# Patient Record
Sex: Male | Born: 1975 | Race: Black or African American | Hispanic: No | Marital: Single | Smoking: Current every day smoker
Health system: Southern US, Community
[De-identification: ages and names within clinical notes are randomized; demographics above are authoritative.]

---

## 2003-02-21 ENCOUNTER — Emergency Department (HOSPITAL_COMMUNITY): Admission: EM | Admit: 2003-02-21 | Discharge: 2003-02-21 | Payer: Self-pay | Admitting: Emergency Medicine

## 2006-02-15 ENCOUNTER — Emergency Department (HOSPITAL_COMMUNITY): Admission: EM | Admit: 2006-02-15 | Discharge: 2006-02-15 | Payer: Self-pay | Admitting: Emergency Medicine

## 2006-06-06 ENCOUNTER — Emergency Department (HOSPITAL_COMMUNITY): Admission: EM | Admit: 2006-06-06 | Discharge: 2006-06-07 | Payer: Self-pay | Admitting: *Deleted

## 2009-06-23 ENCOUNTER — Emergency Department (HOSPITAL_COMMUNITY): Admission: EM | Admit: 2009-06-23 | Discharge: 2009-06-23 | Payer: Self-pay | Admitting: Emergency Medicine

## 2010-03-01 ENCOUNTER — Inpatient Hospital Stay (HOSPITAL_COMMUNITY): Admission: EM | Admit: 2010-03-01 | Discharge: 2010-03-05 | Payer: Self-pay | Admitting: Emergency Medicine

## 2010-03-04 ENCOUNTER — Encounter (INDEPENDENT_AMBULATORY_CARE_PROVIDER_SITE_OTHER): Payer: Self-pay | Admitting: General Surgery

## 2010-03-04 ENCOUNTER — Ambulatory Visit: Payer: Self-pay | Admitting: Surgery

## 2010-10-18 LAB — CBC
HCT: 38.3 % — ABNORMAL LOW (ref 39.0–52.0)
HCT: 38.4 % — ABNORMAL LOW (ref 39.0–52.0)
Hemoglobin: 13 g/dL (ref 13.0–17.0)
Hemoglobin: 13.1 g/dL (ref 13.0–17.0)
MCH: 32.8 pg (ref 26.0–34.0)
MCHC: 33.8 g/dL (ref 30.0–36.0)
MCHC: 34.2 g/dL (ref 30.0–36.0)
RBC: 3.91 MIL/uL — ABNORMAL LOW (ref 4.22–5.81)
RBC: 3.99 MIL/uL — ABNORMAL LOW (ref 4.22–5.81)
RDW: 13 % (ref 11.5–15.5)
WBC: 9.1 10*3/uL (ref 4.0–10.5)

## 2010-10-19 LAB — BASIC METABOLIC PANEL
CO2: 24 mEq/L (ref 19–32)
Calcium: 8.7 mg/dL (ref 8.4–10.5)
GFR calc Af Amer: >60 mL/min (ref >60)
GFR calc non Af Amer: >60 mL/min (ref >60)
Glucose, Bld: 95 mg/dL (ref 70–99)

## 2010-10-19 LAB — CBC
HCT: 40.9 % (ref 39.0–52.0)
HCT: 44.5 % (ref 39.0–52.0)
Hemoglobin: 15.2 g/dL (ref 13.0–17.0)
MCH: 33.8 pg (ref 26.0–34.0)
MCHC: 34.1 g/dL (ref 30.0–36.0)
MCHC: 34.2 g/dL (ref 30.0–36.0)
MCV: 98.8 fL (ref 78.0–100.0)
RBC: 4.5 MIL/uL (ref 4.22–5.81)
WBC: 12.2 10*3/uL — ABNORMAL HIGH (ref 4.0–10.5)
WBC: 14.1 10*3/uL — ABNORMAL HIGH (ref 4.0–10.5)
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
## 2015-05-24 ENCOUNTER — Emergency Department (HOSPITAL_COMMUNITY): Admission: EM | Admit: 2015-05-24 | Discharge: 2015-05-24 | Discharge status: Against Medical Advice | Payer: Self-pay

## 2015-05-24 NOTE — ED Notes (Signed)
No answer when pt called x 3; unable to locate pt

## 2015-10-15 ENCOUNTER — Encounter (HOSPITAL_COMMUNITY): Payer: Self-pay | Admitting: Emergency Medicine

## 2015-10-15 ENCOUNTER — Emergency Department (HOSPITAL_COMMUNITY): Payer: Self-pay

## 2015-10-15 ENCOUNTER — Emergency Department (HOSPITAL_COMMUNITY)
Admission: EM | Admit: 2015-10-15 | Discharge: 2015-10-15 | Disposition: A | Discharge status: Home / Self Care | Payer: Self-pay | Attending: Physician Assistant | Admitting: Physician Assistant

## 2015-10-15 DIAGNOSIS — Y998 Other external cause status: Secondary | ICD-10-CM | POA: Insufficient documentation

## 2015-10-15 DIAGNOSIS — Y9289 Other specified places as the place of occurrence of the external cause: Secondary | ICD-10-CM | POA: Insufficient documentation

## 2015-10-15 DIAGNOSIS — S61111A Laceration without foreign body of right thumb with damage to nail, initial encounter: Secondary | ICD-10-CM | POA: Insufficient documentation

## 2015-10-15 DIAGNOSIS — Y9389 Activity, other specified: Secondary | ICD-10-CM | POA: Insufficient documentation

## 2015-10-15 DIAGNOSIS — W231XXA Caught, crushed, jammed, or pinched between stationary objects, initial encounter: Secondary | ICD-10-CM | POA: Insufficient documentation

## 2015-10-15 DIAGNOSIS — S61319A Laceration without foreign body of unspecified finger with damage to nail, initial encounter: Secondary | ICD-10-CM

## 2015-10-15 DIAGNOSIS — S6991XA Unspecified injury of right wrist, hand and finger(s), initial encounter: Secondary | ICD-10-CM

## 2015-10-15 DIAGNOSIS — Z880 Allergy status to penicillin: Secondary | ICD-10-CM | POA: Insufficient documentation

## 2015-10-15 MED ORDER — CLINDAMYCIN HCL 300 MG PO CAPS: 300.0000 mg | ORAL_CAPSULE | Freq: Three times a day (TID) | ORAL | Status: AC

## 2015-10-15 MED ORDER — LIDOCAINE-EPINEPHRINE (PF) 2 %-1:200000 IJ SOLN
20.0000 mL | Freq: Once | INTRAMUSCULAR | Status: AC
  Administered 2015-10-15: 20 mL
  Filled 2015-10-15: qty 20

## 2015-10-15 MED ORDER — OXYCODONE-ACETAMINOPHEN 5-325 MG PO TABS
1.0000 | ORAL_TABLET | Freq: Once | ORAL | Status: AC
  Administered 2015-10-15: 1 via ORAL
  Filled 2015-10-15: qty 1

## 2015-10-15 MED ORDER — HYDROCODONE-ACETAMINOPHEN 5-325 MG PO TABS: 1.0000 | ORAL_TABLET | ORAL | Status: AC | PRN

## 2015-10-15 MED ORDER — CLINDAMYCIN PHOSPHATE 600 MG/50ML IV SOLN
600.0000 mg | Freq: Once | INTRAVENOUS | Status: AC
Start: 2015-10-15 — End: 2015-10-15
  Administered 2015-10-15: 600 mg via INTRAVENOUS
  Filled 2015-10-15: qty 50

## 2015-10-15 NOTE — Consult Note (Signed)
ORTHOPAEDIC CONSULTATION HISTORY & PHYSICAL REQUESTING PHYSICIAN: Courteney Randall An, MD  Chief Complaint: Right thumb injury  HPI: Thomas Leblanc is a 40 y.o. 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  Part of the frame under tension, released and struck him in the thumb on the right side, injuring it.  His tetanus is reportedly up-to-date.  He has not received any antibiotics in the emergency department.  History reviewed. No pertinent past medical history. History reviewed. No pertinent past surgical history. Social History   Social History  . Marital Status: Single    Spouse Name: N/A  . Number of Children: N/A  . Years of Education: N/A   Social History Main Topics  . Smoking status: Never Smoker   . Smokeless tobacco: None  . Alcohol Use: Yes  . Drug Use: Yes    Special: Marijuana  . Sexual Activity: Not Asked   Other Topics Concern  . None   Social History Narrative  . None   No family history on file. Allergies  Allergen Reactions  . Penicillins    Prior to Admission medications   Medication Sig Start Date End Date Taking? Authorizing Provider  clindamycin (CLEOCIN) 300 MG capsule Take 1 capsule (300 mg total) by mouth 3 (three) times daily. 10/15/15   Mack Hook, MD  HYDROcodone-acetaminophen (NORCO) 5-325 MG tablet Take 1-2 tablets by mouth every 4 (four) hours as needed. 10/15/15   Mack Hook, MD   Dg Finger Thumb Right  10/15/2015  CLINICAL DATA:  Right thumb pain resulting from vehicle falling on thumb while working. EXAM: RIGHT THUMB 2+V COMPARISON:  06/06/2006 FINDINGS: There is a minimally displaced fracture along the radial base of the first distal phalanx. There is also a displaced slightly comminuted fracture over the mid to distal aspect of the first distal phalanx. IMPRESSION: Minimally displaced/ comminuted fracture of the mid to distal aspect of the first distal phalanx as well as minimally displaced fracture  along the radial base of the first distal phalanx. Electronically Signed   By: Elberta Fortis M.D.   On: 10/15/2015 13:54    Positive ROS: All other systems have been reviewed and were otherwise negative with the exception of those mentioned in the HPI and as above.  Physical Exam: Vitals: Refer to EMR. Constitutional:  WD, WN, NAD HEENT:  NCAT, EOMI Neuro/Psych:  Alert & oriented to person, place, and time; appropriate mood & affect Lymphatic: No generalized extremity edema or lymphadenopathy Extremities / MSK:  The extremities are normal with respect to appearance, ranges of motion, joint stability, muscle strength/tone, sensation, & perfusion except as otherwise noted:  The right thumb has a linear slightly oblique laceration on the dorsum extending from the midportion of the nail proximally just proximal to the dorsal IP flexion creases.  It appears to damage skin, nailbed, and there is an underlying fracture.  He can actively flex and extend the thumb at the IP joint.  The nail plate itself is present, in 2 separate pieces, and much of it is no longer adherent to the underlying nail bed.  Assessment: Right thumb open fracture distal phalanx with associated nailbed and soft tissue injury  Plan: I instilled a digital block with lidocaine bearing epinephrine.  A tourniquet was applied to the base of the digit, and it was copiously irrigated.  Then the thumb was prepped with Betadine and draped in usual sterile fashion.  The nail plate was removed from all of the nailbed to  which it was still adherent.  The injury was inspected.  There was indeed damage to the eponychial and hyponychial folds.  Using 5-0 Vicryl Rapide interrupted sutures, the skin was repaired for a total length of about 2 cm.  The distal portion of the fracture of the distal phalanx was then secured to the proximal portion with a percutaneously placed 21-gauge needle which was ultimately bent back over the nail bed and the hub  clipped off.  The nail bed was reapproximated and repaired with 6-0 chromic interrupted sutures, with multiple sutures placed due to the stellate configuration of the nailbed laceration.  With the soft tissues having been reapproximated and the fracture stabilized, the tourniquet was removed, the thumb again cleansed, and a dressing was applied with a dorsal tongue blade splint component.  Xeroform was applied over the nailbed and the wound.  He will receive a dose of IV and the mycin in the emergency department and be discharged on oral clindamycin, with outpatient follow-up arranged in the office.  He was also provided a prescription for pain medication.  No work with the right hand until at least his first follow-up visit.  Cliffton Astersavid A. Janee Mornhompson, MD      [facility]thopaedic & Hand [facility] [address] [place], [place]  [id number] Office: [phone] Mobile: [phone]  10/15/2015, 5:39 PM
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
## 2015-10-15 NOTE — ED Notes (Signed)
MD at bedside. 

## 2015-10-15 NOTE — Discharge Instructions (Signed)
Discharge Instructions   You have a dressing with a splint incorporated in it. Move your fingers as much as possible, making a full fist and fully opening the fist. Elevate your hand to reduce pain & swelling of the digits.  Ice over the operative site may be helpful to reduce pain & swelling.  DO NOT USE HEAT. Pain medicine has been prescribed for you.  Use your medicine as needed over the first 48 hours, and then you can begin to taper your use.  You may use Tylenol in place of your prescribed pain medication, but not IN ADDITION to it. Leave the dressing in place until you return to our office.  You may shower, but keep the bandage clean & dry.  You may drive a car when you are off of prescription pain medications and can safely control your vehicle with both hands. Our office will call you to arrange follow-up   Please call [phone] during normal business hours or [phone] after hours for any problems. Including the following:  - excessive redness of the incisions - drainage for more than 4 days - fever of more than 101.5 F  *Please note that pain medications will not be refilled after hours or on weekends.  WORK STATUS:   NO WORK WITH RIGHT HAND UNTIL AT LEAST FIRST FOLLOW-UP VISIT

## 2015-10-15 NOTE — ED Notes (Signed)
Patient reports dropping car on right thumb at work. 4" laceration to right thumb, bleeding not controlled, swelling and bruising noted to same.

## 2016-05-11 ENCOUNTER — Encounter (HOSPITAL_COMMUNITY): Payer: Self-pay | Admitting: Emergency Medicine

## 2016-05-11 ENCOUNTER — Emergency Department (HOSPITAL_COMMUNITY)
Admission: EM | Admit: 2016-05-11 | Discharge: 2016-05-11 | Disposition: A | Discharge status: Home / Self Care | Payer: Self-pay | Attending: Emergency Medicine | Admitting: Emergency Medicine

## 2016-05-11 DIAGNOSIS — Y939 Activity, unspecified: Secondary | ICD-10-CM | POA: Insufficient documentation

## 2016-05-11 DIAGNOSIS — S0501XA Injury of conjunctiva and corneal abrasion without foreign body, right eye, initial encounter: Secondary | ICD-10-CM | POA: Insufficient documentation

## 2016-05-11 DIAGNOSIS — X58XXXA Exposure to other specified factors, initial encounter: Secondary | ICD-10-CM | POA: Insufficient documentation

## 2016-05-11 DIAGNOSIS — F17210 Nicotine dependence, cigarettes, uncomplicated: Secondary | ICD-10-CM | POA: Insufficient documentation

## 2016-05-11 DIAGNOSIS — Y999 Unspecified external cause status: Secondary | ICD-10-CM | POA: Insufficient documentation

## 2016-05-11 DIAGNOSIS — Y929 Unspecified place or not applicable: Secondary | ICD-10-CM | POA: Insufficient documentation

## 2016-05-11 MED ORDER — CIPROFLOXACIN HCL 0.3 % OP OINT: TOPICAL_OINTMENT | OPHTHALMIC | 0 refills | Status: DC

## 2016-05-11 MED ORDER — FLUORESCEIN SODIUM 1 MG OP STRP
1.0000 | ORAL_STRIP | Freq: Once | OPHTHALMIC | Status: AC
  Administered 2016-05-11: 1 via OPHTHALMIC
  Filled 2016-05-11: qty 1

## 2016-05-11 MED ORDER — CIPROFLOXACIN HCL 0.3 % OP OINT: TOPICAL_OINTMENT | OPHTHALMIC | 0 refills | Status: AC

## 2016-05-11 MED ORDER — PROPARACAINE HCL 0.5 % OP SOLN
1.0000 [drp] | Freq: Once | OPHTHALMIC | Status: AC
  Administered 2016-05-11: 1 [drp] via OPHTHALMIC
  Filled 2016-05-11: qty 15

## 2016-05-11 NOTE — ED Triage Notes (Signed)
Pt c/o sensation of foreign object in eye. Pt works at [employer], felt something land in his eye while working last night. Vision blurry, eye pain, photophobia. Conjunctiva swollen, injected.

## 2016-05-11 NOTE — Discharge Instructions (Signed)
Go to Dr. Laruth BouchardGroat's office in the morning tomorrow.  It is extremely important you do so.

## 2016-05-11 NOTE — ED Provider Notes (Signed)
[facility]-EMERGENCY DEPT Provider Note   CSN: [id number] Arrival date & time: 05/11/16  [id number]  By signing my name below, I, Octavia Heirrianna Nassar, attest that this documentation has been prepared under the direction and in the presence of Bear StearnsKayla Azaan Leask, PA-C.  Electronically Signed: Octavia HeirArianna Nassar, ED Scribe. 05/11/16. 7:12 PM.    History   Chief Complaint No chief complaint on file.   The history is provided by the patient. No language interpreter was used.   HPI Comments: Jorje GuildWilliam Michaelsen is a 40 y.o. male who presents to the Emergency Department complaining of constant, gradual worsening, moderate right eye pain x 1 day. He reports associated photophobia, blurry vision and right eye redness. Pt says he was working on his car last night when something landed in his eye. He has tried flushing his eye and using visine to help with his pain with no relief. He denies any other complaints.   History reviewed. No pertinent past medical history.  There are no active problems to display for this patient.   History reviewed. No pertinent surgical history.     Home Medications    Prior to Admission medications   Medication Sig Start Date End Date Taking? Authorizing Provider  ciprofloxacin (CILOXAN) 0.3 % ophthalmic ointment Instill one drop every hour in affected eye 05/11/16   Cheri FowlerKayla Kiarrah Rausch, PA-C  clindamycin (CLEOCIN) 300 MG capsule Take 1 capsule (300 mg total) by mouth 3 (three) times daily. 10/15/15   Mack Hookavid Thompson, MD  HYDROcodone-acetaminophen (NORCO) 5-325 MG tablet Take 1-2 tablets by mouth every 4 (four) hours as needed. 10/15/15   Mack Hookavid Thompson, MD    Family History History reviewed. No pertinent family history.  Social History Social History  Substance Use Topics  . Smoking status: Current Every Day Smoker    Packs/day: 1.00    Types: Cigarettes  . Smokeless tobacco: Not on file  . Alcohol use Yes     Allergies   Penicillins   Review of Systems Review of Systems  Eyes:  Positive for photophobia, redness and visual disturbance.  All other systems reviewed and are negative.    Physical Exam Updated Vital Signs BP 117/77 (BP Location: Right Arm)   Pulse (!) 59   Temp 97.9 F (36.6 C) (Oral)   Resp 18   SpO2 100%   Physical Exam  Constitutional: He is oriented to person, place, and time. He appears well-developed and well-nourished.  HENT:  Head: Normocephalic and atraumatic.  Right Ear: External ear normal.  Left Ear: External ear normal.  Eyes: EOM and lids are normal. Pupils are equal, round, and reactive to light. Lids are everted and swept, no foreign bodies found. Right eye exhibits no discharge. No foreign body present in the right eye. Left eye exhibits no discharge. No foreign body present in the left eye. Right conjunctiva is injected. No scleral icterus.  Slit lamp exam:      The right eye shows corneal abrasion and fluorescein uptake. The right eye shows no corneal ulcer, no foreign body, no hyphema and no hypopyon.  Right eye with medial peripheral divot to cornea.  No evidence of globe rupture. Negative seidel sign.    Visual Acuity  Right Eye Distance: 20/50 Left Eye Distance: 20/20 Bilateral Distance: 20/20    Neck: No tracheal deviation present.  Pulmonary/Chest: Effort normal. No respiratory distress.  Abdominal: He exhibits no distension.  Musculoskeletal: Normal range of motion.  Neurological: He is alert and oriented to person, place, and time.  Skin: Skin  is warm and dry.  Psychiatric: He has a normal mood and affect. His behavior is normal.  Nursing note and vitals reviewed.    ED Treatments / Results  DIAGNOSTIC STUDIES: Oxygen Saturation is 100% on RA, normal by my interpretation.  COORDINATION OF CARE:  7:11 PM Discussed treatment plan with pt at bedside and pt agreed to plan.  Labs (all labs ordered are listed, but only abnormal results are displayed) Labs Reviewed - No data to display  EKG  EKG  Interpretation None       Radiology No results found.  Procedures Procedures (including critical care time)  Medications Ordered in ED Medications  proparacaine (ALCAINE) 0.5 % ophthalmic solution 1 drop (1 drop Right Eye Given 05/11/16 1920)  fluorescein ophthalmic strip 1 strip (1 strip Right Eye Given 05/11/16 1920)     Initial Impression / Assessment and Plan / ED Course  I have reviewed the triage vital signs and the nursing notes.  Pertinent labs & imaging results that were available during my care of the patient were reviewed by me and considered in my medical decision making (see chart for details).  Clinical Course   Patient presents with findings c/w corneal abrasion.  No foreign bodies.  Decreased visual acuity.  Spoke with Dr. Dione Booze, patient will follow up in clinic tomorrow morning.  Home with cipro drops.  Return precautions discussed.  Stable for discharge.   Case has been discussed with and seen by Dr. Rubin Payor who agrees with the above plan for discharge.  I personally performed the services described in this documentation, which was scribed in my presence. The recorded information has been reviewed and is accurate.  Final Clinical Impressions(s) / ED Diagnoses   Final diagnoses:  Abrasion of right cornea, initial encounter    New Prescriptions New Prescriptions   CIPROFLOXACIN (CILOXAN) 0.3 % OPHTHALMIC OINTMENT    Instill one drop every hour in affected eye         Cheri Fowler, PA-C 05/11/16 1953    Benjiman Core, MD 05/11/16 2313

## 2016-06-16 IMAGING — CR DG FINGER THUMB 2+V*R*
3 series · 3 of 3 positions shown · non-contrast
Comparison: 06/06/2006

CLINICAL DATA: Right thumb pain resulting from vehicle falling on
thumb while working.

EXAM:
RIGHT THUMB 2+V

[x finger pa right]
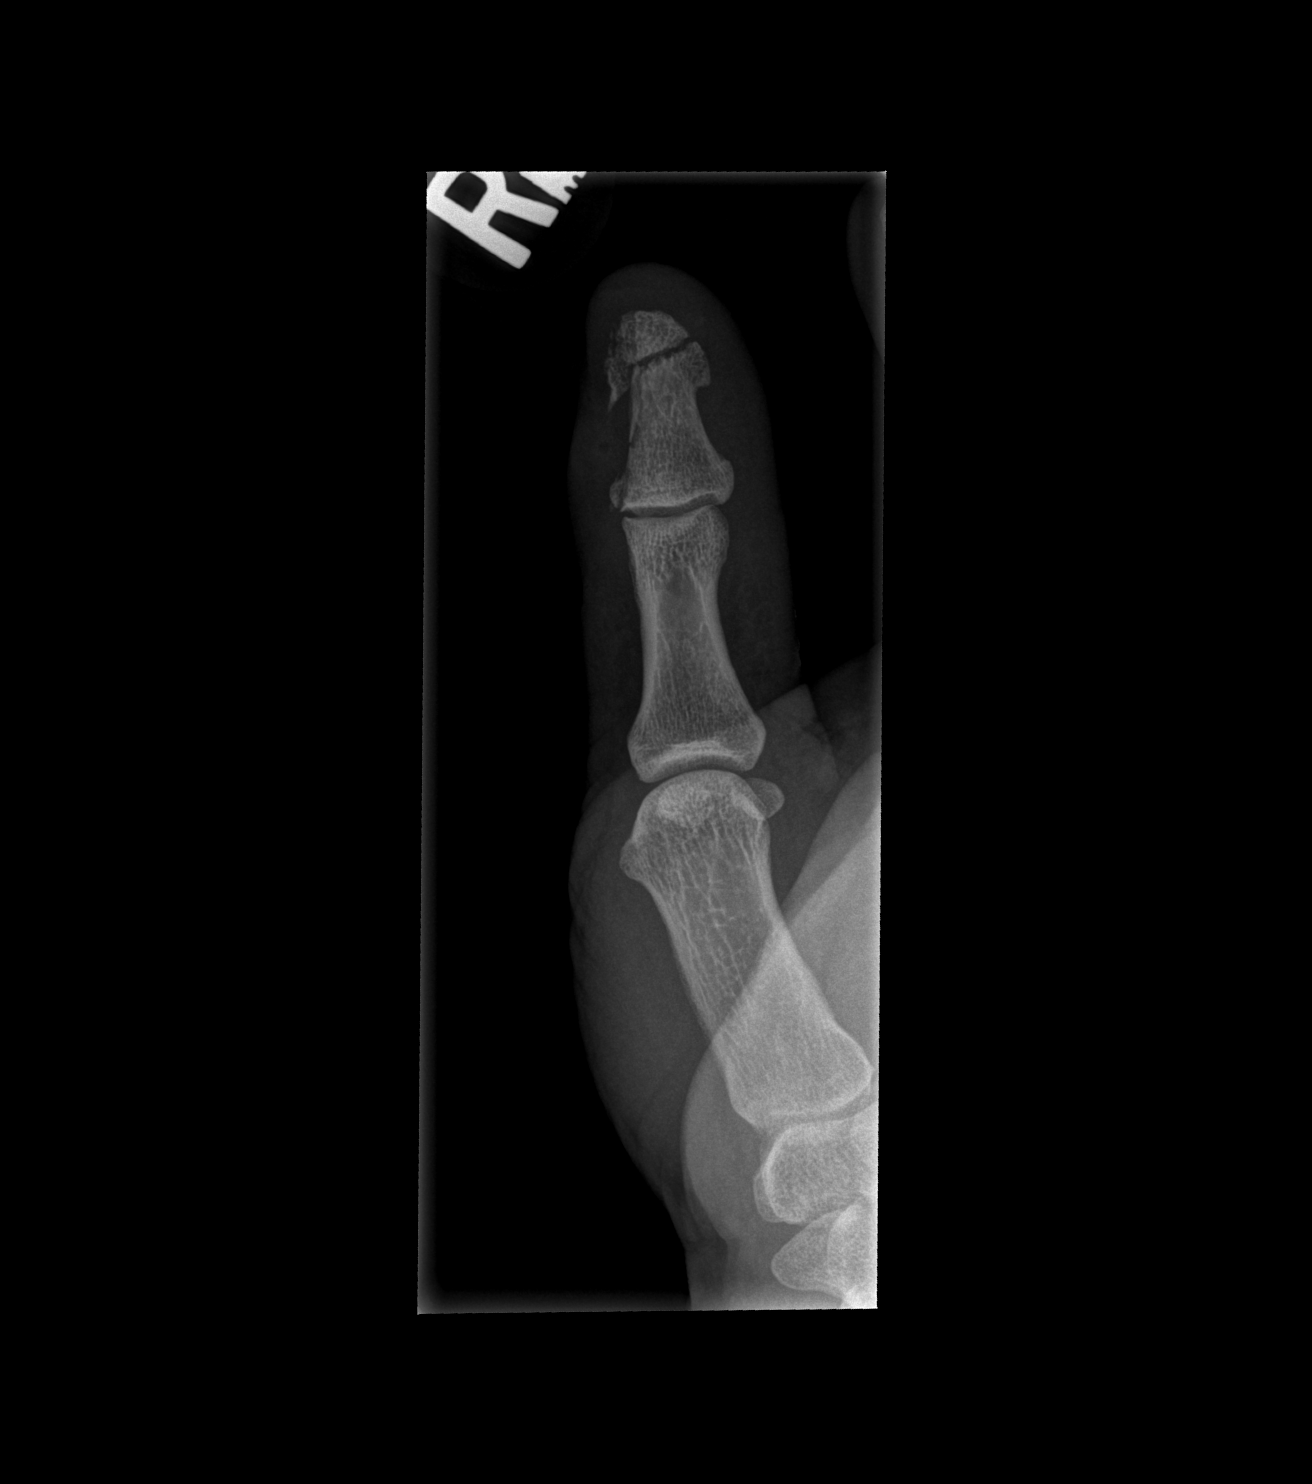

[x finger obl right]
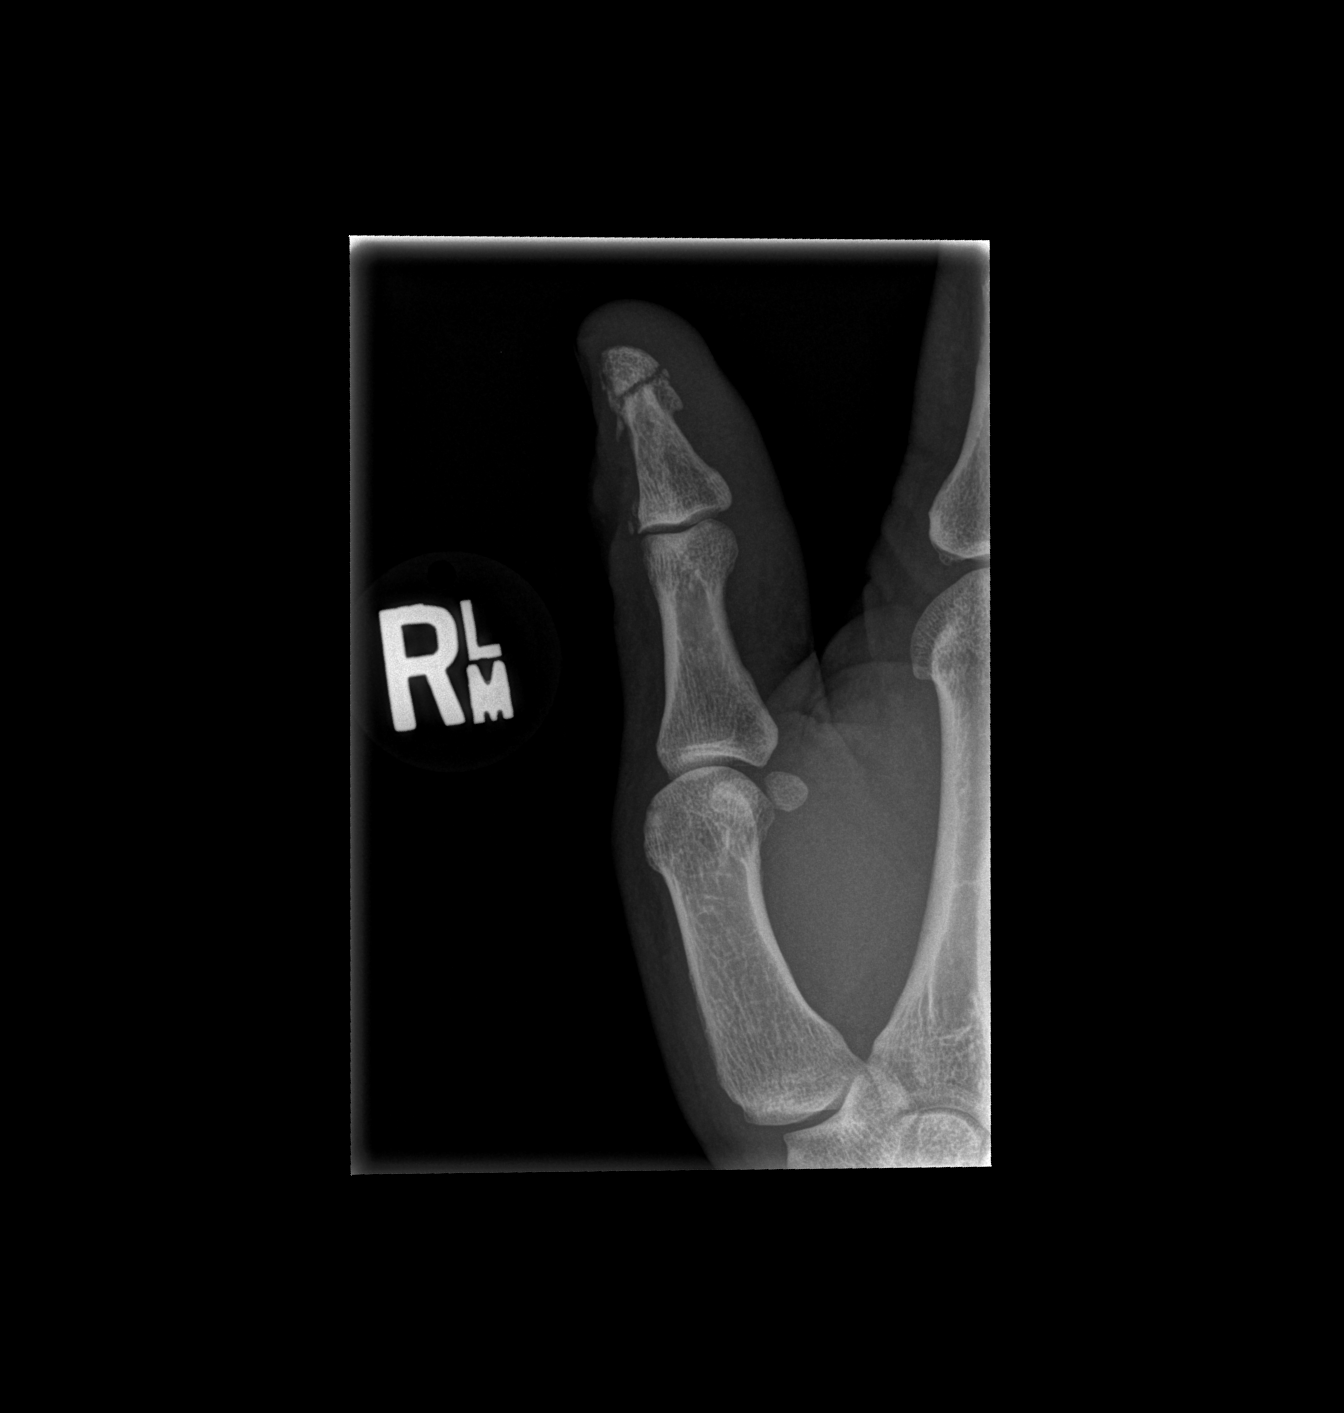

[x finger lat right]
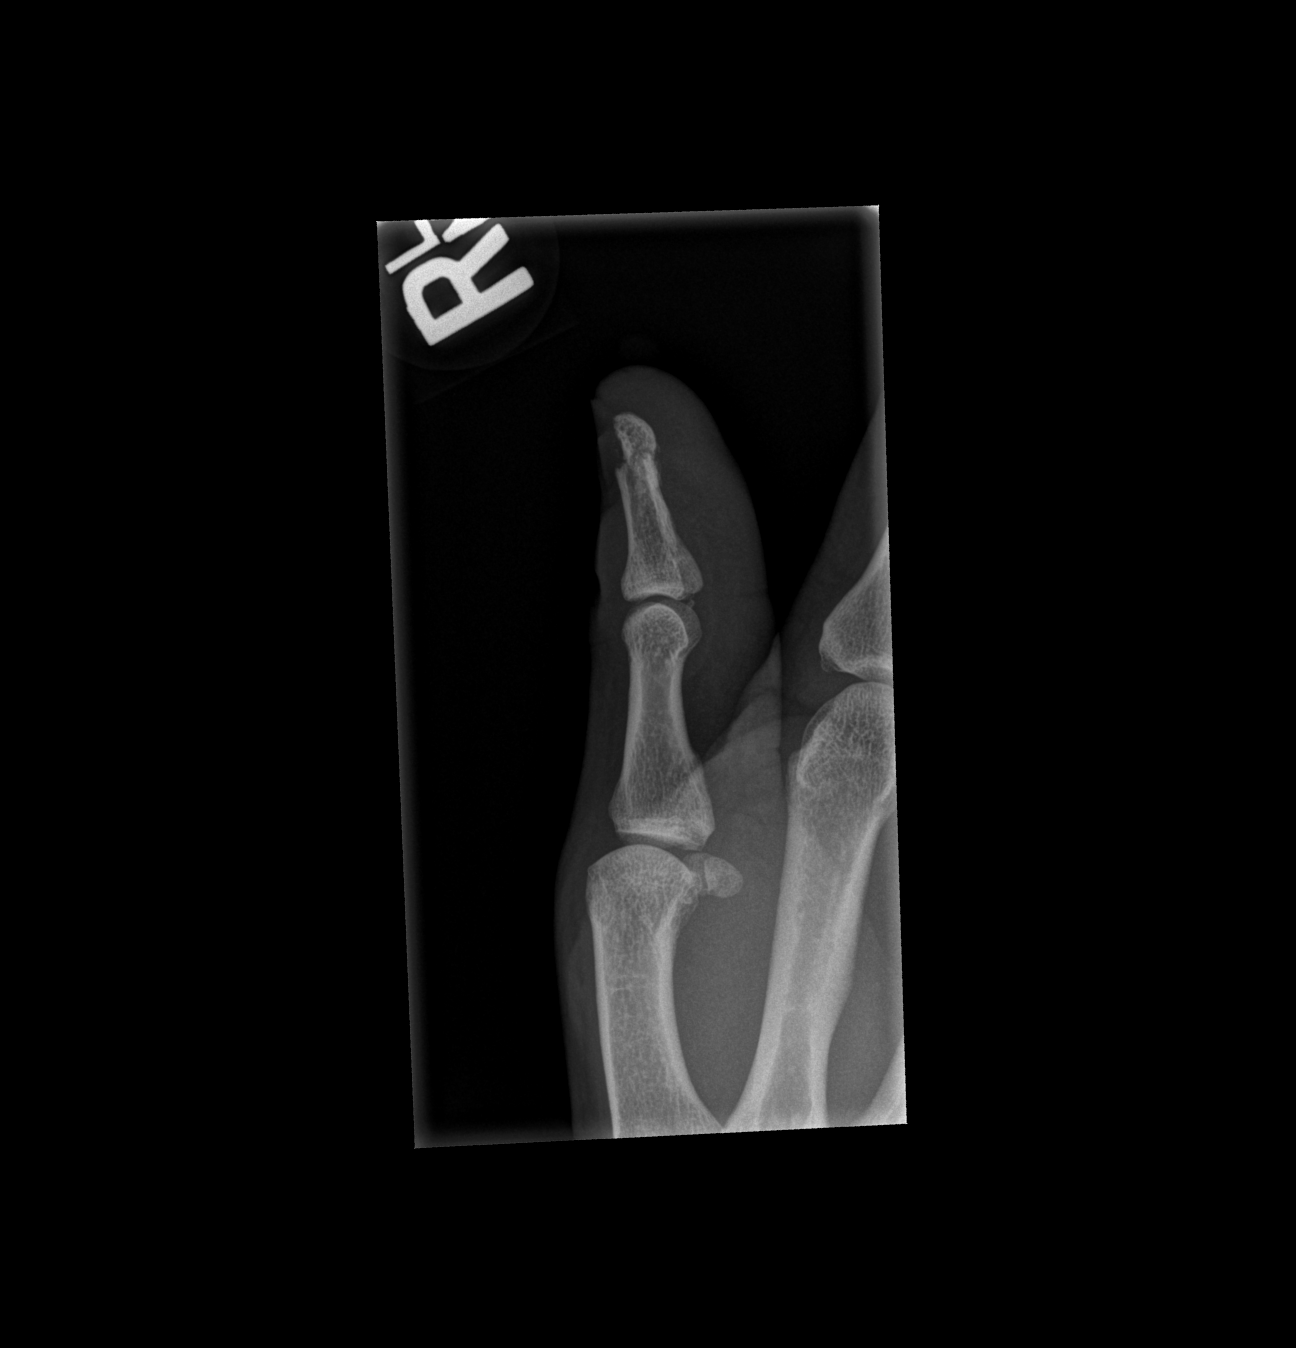

[3 of 3 positions shown; findings below may reference images not displayed]

FINDINGS: There is a minimally displaced fracture along the radial base of the
first distal phalanx. There is also a displaced slightly comminuted
fracture over the mid to distal aspect of the first distal phalanx.
IMPRESSION: Minimally displaced/ comminuted fracture of the mid to distal aspect
of the first distal phalanx as well as minimally displaced fracture
along the radial base of the first distal phalanx.
